# Patient Record
Sex: Female | Born: 1995 | Race: Black or African American | Hispanic: No | Smoking: Never smoker
Health system: Southern US, Community
[De-identification: ages and names within clinical notes are randomized; demographics above are authoritative.]

## PROBLEM LIST (undated history)

## (undated) DIAGNOSIS — J45909 Unspecified asthma, uncomplicated: Secondary | ICD-10-CM

---

## 2021-02-09 ENCOUNTER — Other Ambulatory Visit: Payer: Self-pay

## 2021-02-09 ENCOUNTER — Emergency Department
Admission: EM | Admit: 2021-02-09 | Discharge: 2021-02-10 | Disposition: A | Discharge status: Home / Self Care | Payer: Self-pay | Attending: Emergency Medicine | Admitting: Emergency Medicine

## 2021-02-09 ENCOUNTER — Emergency Department: Payer: Self-pay

## 2021-02-09 DIAGNOSIS — R1010 Upper abdominal pain, unspecified: Secondary | ICD-10-CM

## 2021-02-09 DIAGNOSIS — R1011 Right upper quadrant pain: Secondary | ICD-10-CM | POA: Insufficient documentation

## 2021-02-09 DIAGNOSIS — J45909 Unspecified asthma, uncomplicated: Secondary | ICD-10-CM | POA: Insufficient documentation

## 2021-02-09 HISTORY — DX: Unspecified asthma, uncomplicated: J45.909

## 2021-02-09 LAB — CBC
HCT: 38.6 % (ref 36.0–46.0)
Hemoglobin: 12.5 g/dL (ref 12.0–15.0)
MCH: 27 pg (ref 26.0–34.0)
MCHC: 32.4 g/dL (ref 30.0–36.0)
MCV: 83.4 fL (ref 80.0–100.0)
Platelets: 296 10*3/uL (ref 150–400)
RBC: 4.63 MIL/uL (ref 3.87–5.11)
RDW: 12.7 % (ref 11.5–15.5)
WBC: 10.2 10*3/uL (ref 4.0–10.5)
nRBC: 0 % (ref 0.0–0.2)

## 2021-02-09 LAB — COMPREHENSIVE METABOLIC PANEL
ALT: 13 U/L (ref 0–44)
AST: 18 U/L (ref 15–41)
Albumin: 3.5 g/dL (ref 3.5–5.0)
Alkaline Phosphatase: 56 U/L (ref 38–126)
Anion gap: 6 (ref 5–15)
BUN: 11 mg/dL (ref 6–20)
CO2: 23 mmol/L (ref 22–32)
Calcium: 8.7 mg/dL — ABNORMAL LOW (ref 8.9–10.3)
Chloride: 107 mmol/L (ref 98–111)
Creatinine, Ser: 0.78 mg/dL (ref 0.44–1.00)
GFR, Estimated: >60 mL/min (ref >60)
Glucose, Bld: 89 mg/dL (ref 70–99)
Potassium: 4.1 mmol/L (ref 3.5–5.1)
Sodium: 136 mmol/L (ref 135–145)
Total Bilirubin: 0.4 mg/dL (ref 0.3–1.2)
Total Protein: 6.2 g/dL — ABNORMAL LOW (ref 6.5–8.1)

## 2021-02-09 LAB — URINALYSIS, COMPLETE (UACMP) WITH MICROSCOPIC
Bilirubin Urine: NEGATIVE
Glucose, UA: NEGATIVE mg/dL
Hgb urine dipstick: NEGATIVE
Ketones, ur: NEGATIVE mg/dL
Nitrite: NEGATIVE
Protein, ur: NEGATIVE mg/dL
Specific Gravity, Urine: 1.013 (ref 1.005–1.030)
pH: 7 (ref 5.0–8.0)

## 2021-02-09 LAB — LIPASE, BLOOD: Lipase: 35 U/L (ref 11–51)

## 2021-02-09 MED ORDER — KETOROLAC TROMETHAMINE 30 MG/ML IJ SOLN
30.0000 mg | Freq: Once | INTRAMUSCULAR | Status: AC
  Administered 2021-02-10: 30 mg via INTRAMUSCULAR
  Filled 2021-02-09: qty 1

## 2021-02-09 NOTE — ED Triage Notes (Signed)
Pt presents to ER c/o abdominal pain d/t a "bump" she feels in her stomach that she has felt for appx 1-2 months.  Pt states pain in right side of abdomen where bump was is worse today than normal.  Pt denies any hernia and any abdominal problems.

## 2021-02-10 NOTE — ED Provider Notes (Signed)
[facility] Emergency Department Provider Note   ____________________________________________    I have reviewed the triage vital signs and the nursing notes.   HISTORY  Chief Complaint Abdominal Pain     HPI Sherri Dillon is a 25 y.o. female who presents with complaints of abdominal pain.  Patient describes right upper quadrant abdominal discomfort which has been worse today.  She reports has had this many times over the last year, seems to be intermittent.  Occasionally she feels like there is swelling in her right upper quadrant.  Denies nausea or vomiting.  Is not take anything for this.  No history of abdominal surgery  Past Medical History:  Diagnosis Date  . Asthma     There are no problems to display for this patient.     Prior to Admission medications   Not on File     Allergies Patient has no known allergies.  No family history on file.  Social History Social History   Tobacco Use  . Smoking status: Never Smoker  . Smokeless tobacco: Never Used  Substance Use Topics  . Alcohol use: Not Currently  . Drug use: Not Currently    Review of Systems  Constitutional: No fever/chills Eyes: No visual changes.  ENT: No sore throat. Cardiovascular: Denies chest pain. Respiratory: Denies shortness of breath. Gastrointestinal: As above Genitourinary: Negative for dysuria. Musculoskeletal: Negative for back pain. Skin: Negative for rash. Neurological: Negative for headaches or weakness   ____________________________________________   PHYSICAL EXAM:  VITAL SIGNS: ED Triage Vitals  Enc Vitals Group     BP 02/09/21 2135 (!) 135/96     Pulse Rate 02/09/21 2135 70     Resp 02/09/21 2135 16     Temp 02/09/21 2135 98.3 F (36.8 C)     Temp Source 02/09/21 2135 Oral     SpO2 02/09/21 2135 99 %     Weight 02/09/21 2136 69.4 kg (153 lb)     Height 02/09/21 2136 1.499 m (4\' 11" )     Head Circumference --      Peak Flow  --      Pain Score 02/09/21 2135 6     Pain Loc --      Pain Edu? --      Excl. in GC? --     Constitutional: Alert and oriented. No acute distress. Pleasant and interactive  Nose: No congestion/rhinnorhea. Mouth/Throat: Mucous membranes are moist.    Cardiovascular: Normal rate, regular rhythm. Grossly normal heart sounds.  Good peripheral circulation. Respiratory: Normal respiratory effort.  No retractions. Lungs CTAB. Gastrointestinal: Soft, mild tenderness right upper quadrant,. No distention.  No CVA tenderness.  Musculoskeletal: No lower extremity tenderness nor edema.  Warm and well perfused Neurologic:  Normal speech and language. No gross focal neurologic deficits are appreciated.  Skin:  Skin is warm, dry and intact. No rash noted. Psychiatric: Mood and affect are normal. Speech and behavior are normal.  ____________________________________________   LABS (all labs ordered are listed, but only abnormal results are displayed)  Labs Reviewed  COMPREHENSIVE METABOLIC PANEL - Abnormal; Notable for the following components:      Result Value   Calcium 8.7 (*)    Total Protein 6.2 (*)    All other components within normal limits  URINALYSIS, COMPLETE (UACMP) WITH MICROSCOPIC - Abnormal; Notable for the following components:   Color, Urine STRAW (*)    APPearance CLEAR (*)    Leukocytes,Ua TRACE (*)    Bacteria,  UA RARE (*)    All other components within normal limits  LIPASE, BLOOD  CBC  PREGNANCY, URINE   ____________________________________________  EKG  None ____________________________________________  RADIOLOGY  Ultrasound reviewed by me, normal ____________________________________________   PROCEDURES  Procedure(s) performed: No  Procedures   Critical Care performed: No ____________________________________________   INITIAL IMPRESSION / ASSESSMENT AND PLAN / ED COURSE  Pertinent labs & imaging results that were available during my care of  the patient were reviewed by me and considered in my medical decision making (see chart for details).  Patient presents with mild right upper quadrant abdominal pain, worse today than it has been in the past although it seems to be somewhat of an intermittent chronic issue.  Possibility for cholelithiasis, doubt cholecystitis given reassuring labs, normal white blood cell count.  Will send for ultrasound, give IM Toradol and reeval  Patient proved her IM Toradol, ultrasound is negative for cholelithiasis or cholecystitis, proper for discharge with outpatient follow-up with GI.    ____________________________________________   FINAL CLINICAL IMPRESSION(S) / ED DIAGNOSES  Final diagnoses:  Upper abdominal pain        Note:  This document was prepared using Dragon voice recognition software and may include unintentional dictation errors.   Jene Every, MD 02/10/21 [phone]

## 2021-10-07 IMAGING — US US ABDOMEN LIMITED RUQ/ASCITES
1 series · 14 of 24 positions shown · non-contrast
Comparison: None.

CLINICAL DATA: 24-year-old female with right upper quadrant
abdominal pain.

EXAM:
ULTRASOUND ABDOMEN LIMITED RIGHT UPPER QUADRANT

[Series 1: us abdomen limited ruq (liver/gb) · 14 of 24 slices shown]
[im 1/24]
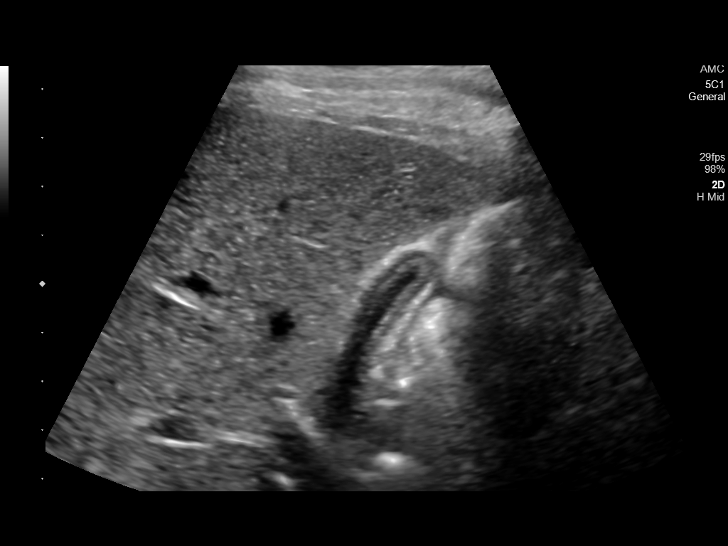
[im 3/24]
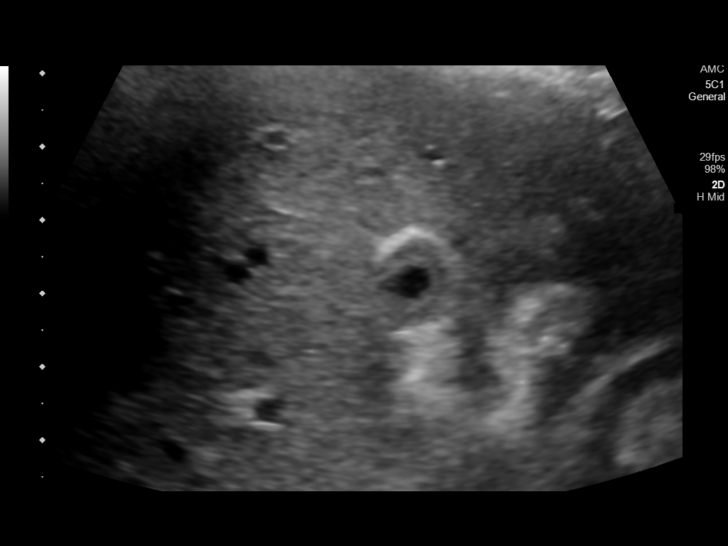
[im 5/24]
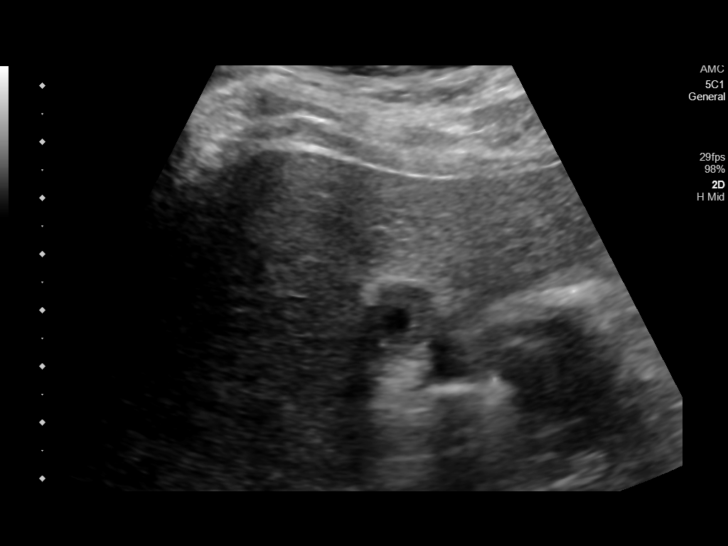
[im 7/24]
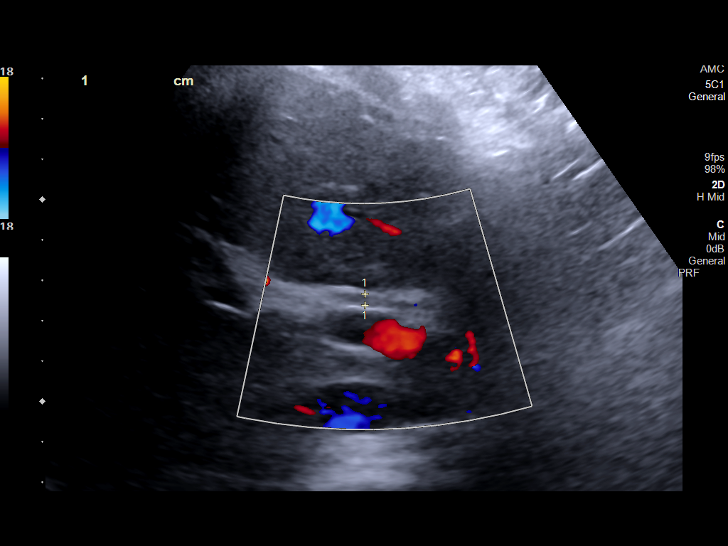
[im 8/24]
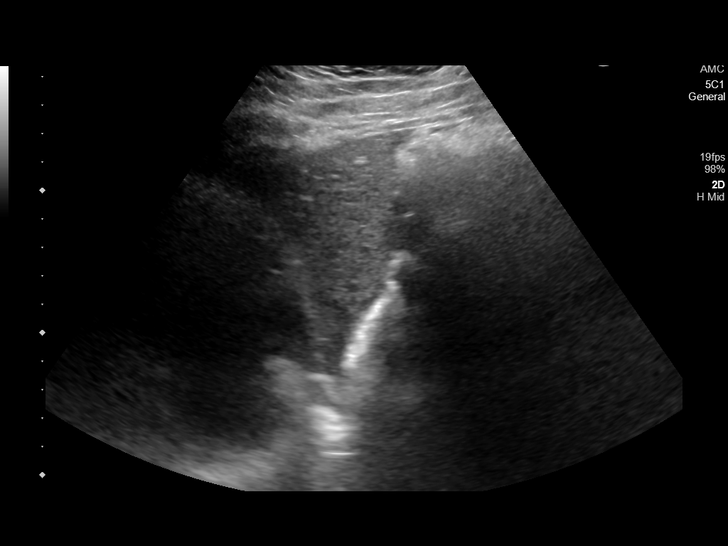
[im 10/24]
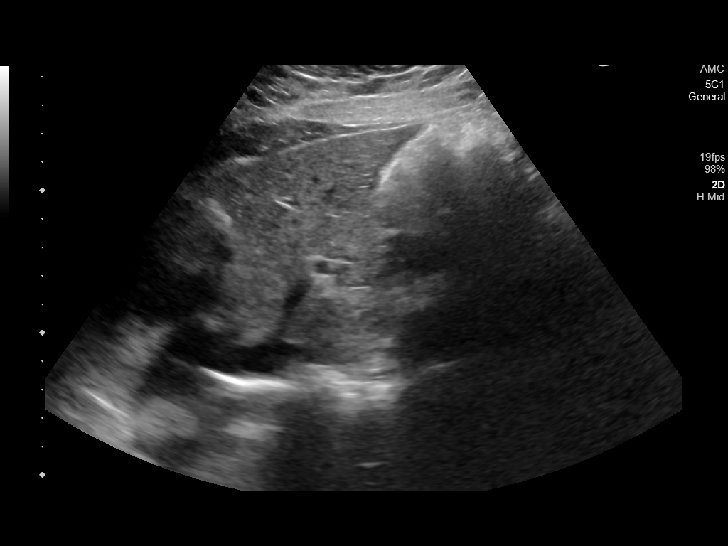
[im 12/24]
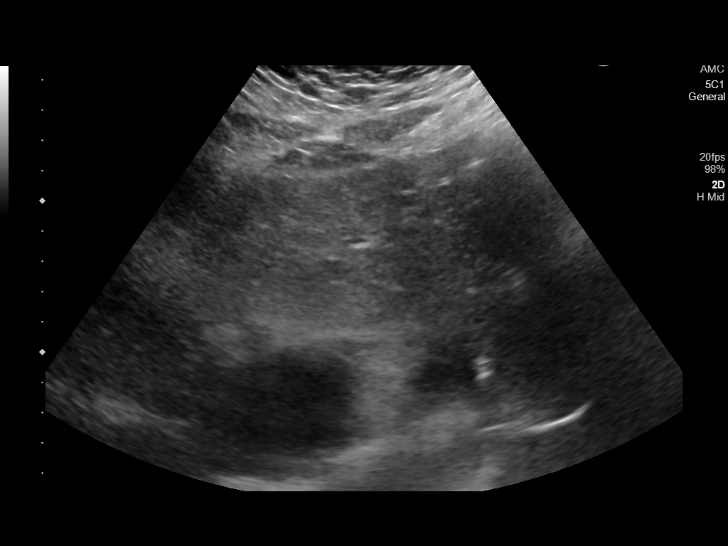
[im 13/24]
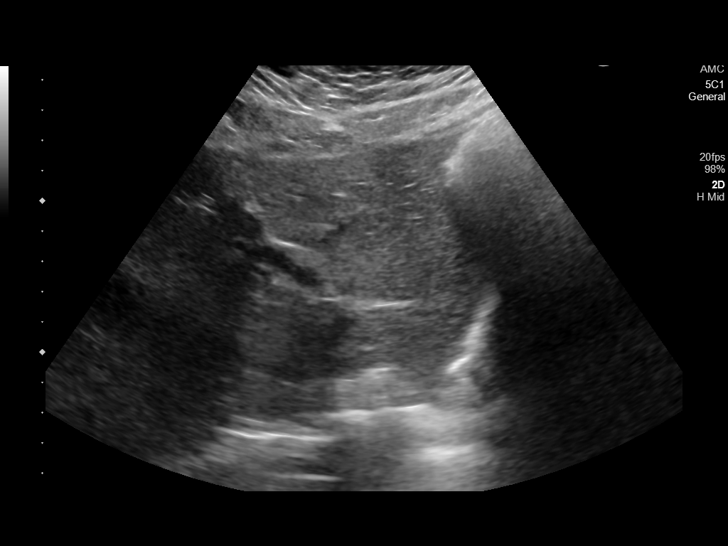
[im 15/24]
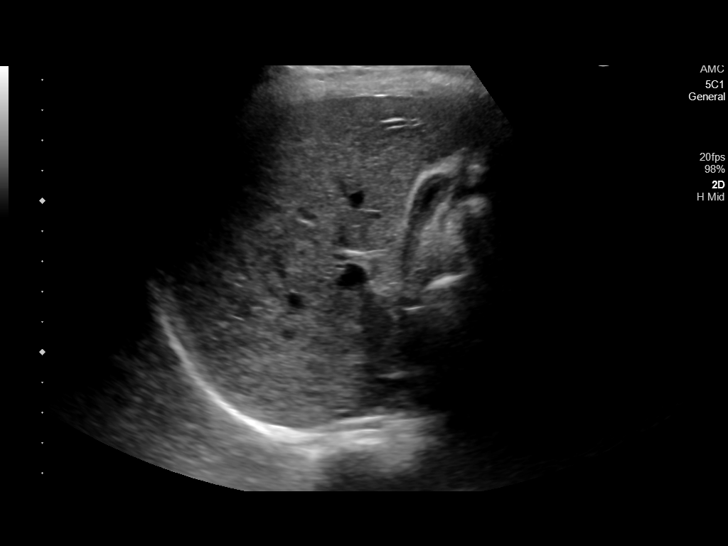
[im 17/24]
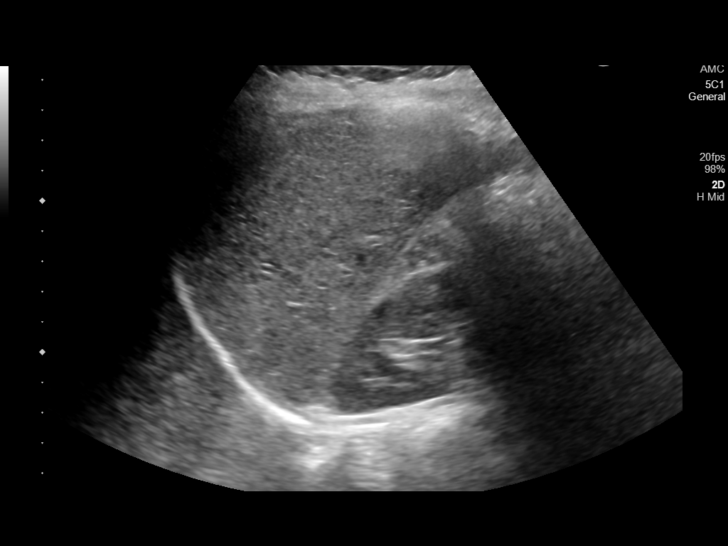
[im 19/24]
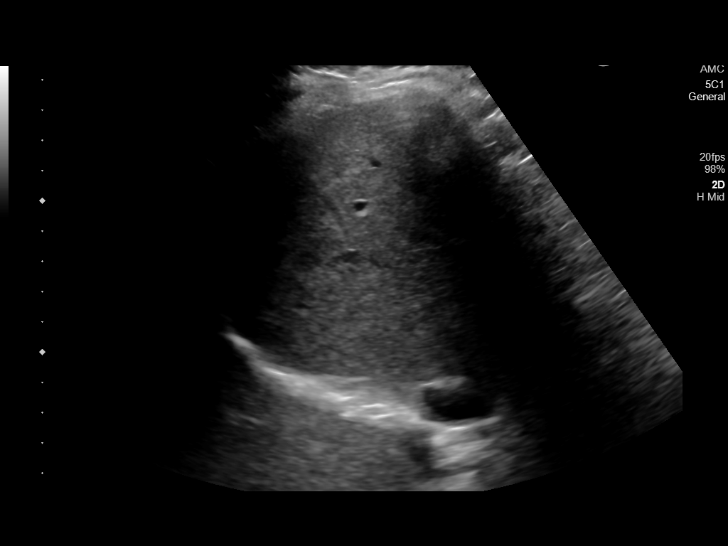
[im 20/24]
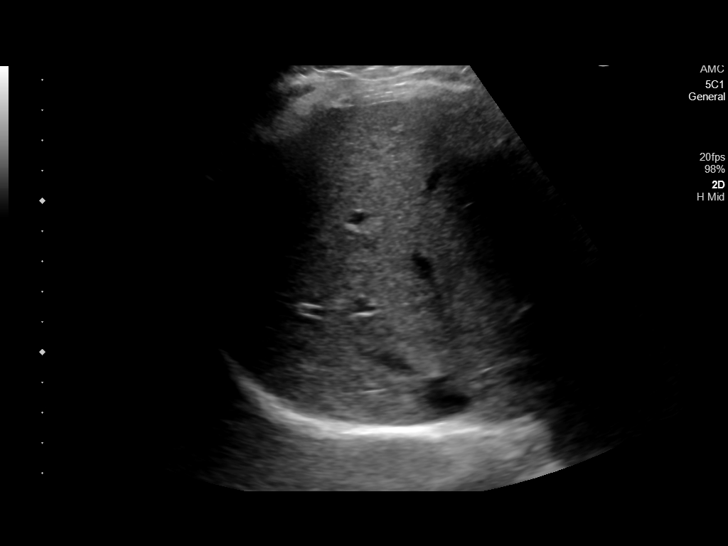
[im 22/24]
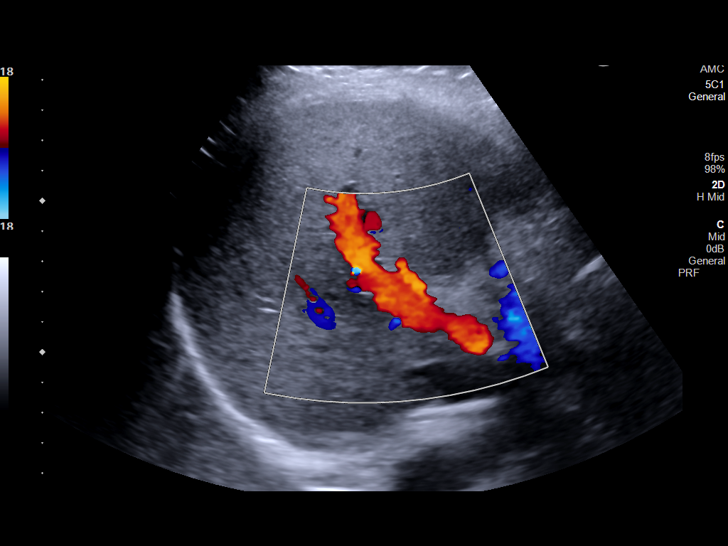
[im 24/24]
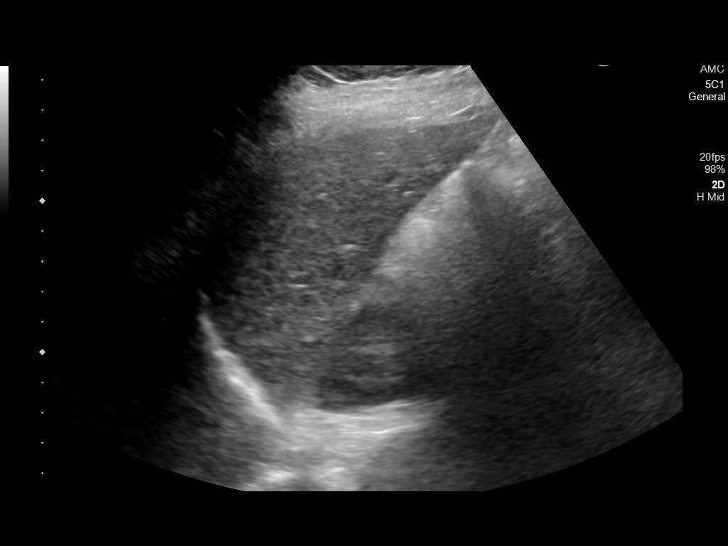

[14 of 24 positions shown; findings below may reference images not displayed]

FINDINGS: Gallbladder:

No gallstones or wall thickening visualized. No sonographic Murphy
sign noted by sonographer. The gallbladder is predominantly
contracted.

Common bile duct:

Diameter: 3 mm

Liver:

No focal lesion identified. Within normal limits in parenchymal
echogenicity. Portal vein is patent on color Doppler imaging with
normal direction of blood flow towards the liver.

Other: None.
IMPRESSION: Unremarkable right upper quadrant ultrasound.
